# Patient Record
Sex: Female | Born: 1960 | Race: Black or African American | Hispanic: No | Marital: Married | Smoking: Current every day smoker
Health system: Southern US, Community
[De-identification: ages and names within clinical notes are randomized; demographics above are authoritative.]

## PROBLEM LIST (undated history)

## (undated) DIAGNOSIS — E785 Hyperlipidemia, unspecified: Secondary | ICD-10-CM

## (undated) DIAGNOSIS — I10 Essential (primary) hypertension: Secondary | ICD-10-CM

## (undated) DIAGNOSIS — M1990 Unspecified osteoarthritis, unspecified site: Secondary | ICD-10-CM

## (undated) HISTORY — DX: Hyperlipidemia, unspecified: E78.5

## (undated) HISTORY — PX: OOPHORECTOMY: SHX86

---

## 2002-10-30 HISTORY — PX: COLONOSCOPY: SHX174

## 2004-09-09 ENCOUNTER — Emergency Department: Payer: Self-pay | Admitting: Emergency Medicine

## 2004-10-25 ENCOUNTER — Emergency Department: Payer: Self-pay | Admitting: Emergency Medicine

## 2004-10-30 HISTORY — PX: ABDOMINAL HYSTERECTOMY: SHX81

## 2005-11-18 ENCOUNTER — Emergency Department: Payer: Self-pay | Admitting: Emergency Medicine

## 2011-10-31 HISTORY — PX: BREAST CYST ASPIRATION: SHX578

## 2015-12-16 ENCOUNTER — Encounter: Payer: Self-pay | Admitting: General Surgery

## 2015-12-27 ENCOUNTER — Ambulatory Visit (INDEPENDENT_AMBULATORY_CARE_PROVIDER_SITE_OTHER): Admitting: General Surgery

## 2015-12-27 ENCOUNTER — Encounter: Payer: Self-pay | Admitting: General Surgery

## 2015-12-27 VITALS — BP 132/78 | HR 78 | Resp 12 | Ht 64.0 in | Wt 165.0 lb

## 2015-12-27 DIAGNOSIS — D1720 Benign lipomatous neoplasm of skin and subcutaneous tissue of unspecified limb: Secondary | ICD-10-CM

## 2015-12-27 NOTE — Patient Instructions (Addendum)
Patient to be scheduled at [facility] for right scapula lipoma excision.   This patient's surgery has been scheduled for 01-03-16 at [facility].

## 2015-12-27 NOTE — Progress Notes (Signed)
Patient ID: Teresa Cordova, female   DOB: May 06, 1961, 55 y.o.   MRN: [id number]  Chief Complaint  Patient presents with  . Other    lump on right scalpula    HPI Teresa Cordova is a 55 y.o. female here today for a evaluation of a right scapula mass. Patient states she noticed this area about  2 years and has got bigger in the last year. No pain. Has never noticed anything like this before. Family history of colon cancer. Last colonoscopy 2004 I have reviewed the history of present illness with the patient.  HPI  Past Medical History  Diagnosis Date  . Hyperlipidemia     Past Surgical History  Procedure Laterality Date  . Abdominal hysterectomy  2006  . Cesarean section  1981  . Colonoscopy  2004    Family History  Problem Relation Age of Onset  . Lung cancer Mother   . Colon cancer Father     Social History Social History  Substance Use Topics  . Smoking status: Current Every Day Smoker -- 20 years  . Smokeless tobacco: None  . Alcohol Use: 0.6 oz/week    1 Standard drinks or equivalent per week    Allergies  Allergen Reactions  . Penicillins Hives    No current outpatient prescriptions on file.   No current facility-administered medications for this visit.    Review of Systems Review of Systems  Constitutional: Negative.   Respiratory: Negative.   Cardiovascular: Negative.     Blood pressure 132/78, pulse 78, resp. rate 12, height 5\' 4"  (1.626 m), weight 165 lb (74.844 kg).  Physical Exam Physical Exam  Constitutional: She is oriented to person, place, and time. She appears well-developed and well-nourished.  Eyes: Conjunctivae are normal. No scleral icterus.  Neck: Neck supple.  Cardiovascular: Normal rate, regular rhythm and normal heart sounds.   Pulmonary/Chest: Effort normal and breath sounds normal.    Lymphadenopathy:    She has no cervical adenopathy.    She has no axillary adenopathy.  Neurological: She is alert and oriented to person,  place, and time.  Skin: Skin is warm and dry.    Data Reviewed Notes reviewed  Assessment   Large right scapula lipoma. Recommend excision in SDS   Plan    Procedure risks and benefits explained. Pt is agreeable.  Patient to be scheduled at [facility] for right scapula lipoma excision.   This patient's surgery has been scheduled for 01-03-16 at [facility].    This information has been scribed by Gaspar Cola CMA.     Sydni Elizarraraz G 12/27/2015, 2:23 PM

## 2015-12-28 ENCOUNTER — Telehealth: Payer: Self-pay | Admitting: *Deleted

## 2015-12-28 NOTE — Telephone Encounter (Signed)
Patient is having surgery on 01/03/16 by Dr.Sankar and needs to reschedule.

## 2015-12-29 NOTE — Telephone Encounter (Signed)
Patient contacted and states she forgot about an appointment her husband has and will not be able to have surgery on 01-03-16 at [facility].  This patient's surgery has been rescheduled for 01-13-16.

## 2015-12-30 ENCOUNTER — Other Ambulatory Visit: Payer: Self-pay

## 2015-12-30 ENCOUNTER — Encounter: Payer: Self-pay | Admitting: *Deleted

## 2015-12-30 NOTE — Patient Instructions (Signed)
  Your procedure is scheduled on: 01-13-16  Report to [place] To find out your arrival time please call [phone] between 1PM - 3PM on 01-12-16  Remember: Instructions that are not followed completely may result in serious medical risk, up to and including death, or upon the discretion of your surgeon and anesthesiologist your surgery may need to be rescheduled.    _X___ 1. Do not eat food or drink liquids after midnight. No gum chewing or hard candies.     _X___ 2. No Alcohol for 24 hours before or after surgery.   ____ 3. Bring all medications with you on the day of surgery if instructed.    ____ 4. Notify your doctor if there is any change in your medical condition     (cold, fever, infections).     Do not wear jewelry, make-up, hairpins, clips or nail polish.  Do not wear lotions, powders, or perfumes. You may wear deodorant.  Do not shave 48 hours prior to surgery. Men may shave face and neck.  Do not bring valuables to the hospital.    [facility] is not responsible for any belongings or valuables.               Contacts, dentures or bridgework may not be worn into surgery.  Leave your suitcase in the car. After surgery it may be brought to your room.  For patients admitted to the hospital, discharge time is determined by your treatment team.   Patients discharged the day of surgery will not be allowed to drive home.   Please read over the following fact sheets that you were given:     ____ Take these medicines the morning of surgery with A SIP OF WATER:    1. NONE  2.   3.   4.  5.  6.  ____ Fleet Enema (as directed)   ____ Use CHG Soap as directed  ____ Use inhalers on the day of surgery  ____ Stop metformin 2 days prior to surgery    ____ Take 1/2 of usual insulin dose the night before surgery and none on the morning of surgery.   ____ Stop Coumadin/Plavix/aspirin-N/A  _X___ Stop Anti-inflammatories-STOP IBUPROFEN 7 DAYS  PRIOR TO SURGERY-NO NSAIDS OR ASA PRODUCTS-TYLENOL OK    ____ Stop supplements until after surgery.    ____ Bring C-Pap to the hospital.

## 2016-01-13 ENCOUNTER — Ambulatory Visit: Admitting: Anesthesiology

## 2016-01-13 ENCOUNTER — Encounter: Payer: Self-pay | Admitting: Anesthesiology

## 2016-01-13 ENCOUNTER — Encounter
Admission: RE | Disposition: A | Discharge status: Home / Self Care | Payer: Self-pay | Source: Ambulatory Visit | Attending: General Surgery

## 2016-01-13 ENCOUNTER — Ambulatory Visit
Admission: RE | Admit: 2016-01-13 | Discharge: 2016-01-13 | Disposition: A | Discharge status: Home / Self Care | Source: Ambulatory Visit | Attending: General Surgery | Admitting: General Surgery

## 2016-01-13 DIAGNOSIS — Z801 Family history of malignant neoplasm of trachea, bronchus and lung: Secondary | ICD-10-CM | POA: Diagnosis not present

## 2016-01-13 DIAGNOSIS — Z880 Allergy status to penicillin: Secondary | ICD-10-CM | POA: Diagnosis not present

## 2016-01-13 DIAGNOSIS — Z800 Family history of malignant neoplasm of digestive organs: Secondary | ICD-10-CM | POA: Insufficient documentation

## 2016-01-13 DIAGNOSIS — F17200 Nicotine dependence, unspecified, uncomplicated: Secondary | ICD-10-CM | POA: Diagnosis not present

## 2016-01-13 DIAGNOSIS — Z90710 Acquired absence of both cervix and uterus: Secondary | ICD-10-CM | POA: Diagnosis not present

## 2016-01-13 DIAGNOSIS — E785 Hyperlipidemia, unspecified: Secondary | ICD-10-CM | POA: Insufficient documentation

## 2016-01-13 DIAGNOSIS — M1990 Unspecified osteoarthritis, unspecified site: Secondary | ICD-10-CM | POA: Diagnosis not present

## 2016-01-13 DIAGNOSIS — D1721 Benign lipomatous neoplasm of skin and subcutaneous tissue of right arm: Secondary | ICD-10-CM | POA: Diagnosis not present

## 2016-01-13 DIAGNOSIS — D171 Benign lipomatous neoplasm of skin and subcutaneous tissue of trunk: Secondary | ICD-10-CM | POA: Diagnosis present

## 2016-01-13 DIAGNOSIS — D1720 Benign lipomatous neoplasm of skin and subcutaneous tissue of unspecified limb: Secondary | ICD-10-CM

## 2016-01-13 HISTORY — DX: Unspecified osteoarthritis, unspecified site: M19.90

## 2016-01-13 HISTORY — PX: EXCISION MASS UPPER EXTREMETIES: SHX6704

## 2016-01-13 SURGERY — EXCISION MASS UPPER EXTREMITIES
Anesthesia: General | Laterality: Right | Wound class: Clean

## 2016-01-13 MED ORDER — OXYCODONE HCL 5 MG/5ML PO SOLN: 5.0000 mg | Freq: Once | ORAL | Status: AC | PRN

## 2016-01-13 MED ORDER — BUPIVACAINE-EPINEPHRINE 0.5% -1:200000 IJ SOLN
INTRAMUSCULAR | Status: DC | PRN
  Administered 2016-01-13: 14 mL

## 2016-01-13 MED ORDER — GLYCOPYRROLATE 0.2 MG/ML IJ SOLN
INTRAMUSCULAR | Status: DC | PRN
  Administered 2016-01-13: 0.2 mg via INTRAVENOUS

## 2016-01-13 MED ORDER — FAMOTIDINE 20 MG PO TABS
20.0000 mg | ORAL_TABLET | Freq: Once | ORAL | Status: AC
  Administered 2016-01-13: 20 mg via ORAL

## 2016-01-13 MED ORDER — EPHEDRINE SULFATE 50 MG/ML IJ SOLN
INTRAMUSCULAR | Status: DC | PRN
  Administered 2016-01-13: 5 mg via INTRAVENOUS
  Administered 2016-01-13: 10 mg via INTRAVENOUS

## 2016-01-13 MED ORDER — ONDANSETRON HCL 4 MG/2ML IJ SOLN
INTRAMUSCULAR | Status: DC | PRN
  Administered 2016-01-13: 4 mg via INTRAVENOUS

## 2016-01-13 MED ORDER — CHLORHEXIDINE GLUCONATE 4 % EX LIQD: 1.0000 "application " | Freq: Once | CUTANEOUS | Status: DC

## 2016-01-13 MED ORDER — DEXAMETHASONE SODIUM PHOSPHATE 10 MG/ML IJ SOLN
INTRAMUSCULAR | Status: DC | PRN
  Administered 2016-01-13: 5 mg via INTRAVENOUS

## 2016-01-13 MED ORDER — BUPIVACAINE-EPINEPHRINE (PF) 0.5% -1:200000 IJ SOLN
INTRAMUSCULAR | Status: AC
  Filled 2016-01-13: qty 30

## 2016-01-13 MED ORDER — PHENYLEPHRINE HCL 10 MG/ML IJ SOLN
INTRAMUSCULAR | Status: DC | PRN
  Administered 2016-01-13 (×5): 100 ug via INTRAVENOUS

## 2016-01-13 MED ORDER — FAMOTIDINE 20 MG PO TABS
ORAL_TABLET | ORAL | Status: AC
  Administered 2016-01-13: 20 mg via ORAL
  Filled 2016-01-13: qty 1

## 2016-01-13 MED ORDER — LACTATED RINGERS IV SOLN
INTRAVENOUS | Status: DC
  Administered 2016-01-13: 08:00:00 via INTRAVENOUS

## 2016-01-13 MED ORDER — TRAMADOL HCL 50 MG PO TABS: 50.0000 mg | ORAL_TABLET | Freq: Four times a day (QID) | ORAL | Status: AC | PRN

## 2016-01-13 MED ORDER — OXYCODONE HCL 5 MG PO TABS
ORAL_TABLET | ORAL | Status: AC
  Filled 2016-01-13: qty 1

## 2016-01-13 MED ORDER — PROPOFOL 10 MG/ML IV BOLUS
INTRAVENOUS | Status: DC | PRN
  Administered 2016-01-13: 100 mg via INTRAVENOUS
  Administered 2016-01-13: 200 mg via INTRAVENOUS

## 2016-01-13 MED ORDER — OXYCODONE HCL 5 MG PO TABS
5.0000 mg | ORAL_TABLET | Freq: Once | ORAL | Status: AC | PRN
  Administered 2016-01-13: 5 mg via ORAL

## 2016-01-13 MED ORDER — FENTANYL CITRATE (PF) 100 MCG/2ML IJ SOLN
INTRAMUSCULAR | Status: DC | PRN
  Administered 2016-01-13 (×3): 50 ug via INTRAVENOUS

## 2016-01-13 MED ORDER — LIDOCAINE HCL (PF) 2 % IJ SOLN
INTRAMUSCULAR | Status: DC | PRN
  Administered 2016-01-13: 50 mg

## 2016-01-13 MED ORDER — MIDAZOLAM HCL 5 MG/5ML IJ SOLN
INTRAMUSCULAR | Status: DC | PRN
  Administered 2016-01-13: 2 mg via INTRAVENOUS

## 2016-01-13 MED ORDER — FENTANYL CITRATE (PF) 100 MCG/2ML IJ SOLN: 25.0000 ug | INTRAMUSCULAR | Status: DC | PRN

## 2016-01-13 SURGICAL SUPPLY — 15 items
BNDG COHESIVE 4X5 TAN STRL (GAUZE/BANDAGES/DRESSINGS) ×3 IMPLANT
CANISTER SUCT 1200ML W/VALVE (MISCELLANEOUS) ×3 IMPLANT
CHLORAPREP W/TINT 26ML (MISCELLANEOUS) ×3 IMPLANT
ELECT REM PT RETURN 9FT ADLT (ELECTROSURGICAL) ×3
ELECTRODE REM PT RTRN 9FT ADLT (ELECTROSURGICAL) ×1 IMPLANT
GLOVE BIO SURGEON STRL SZ7 (GLOVE) ×15 IMPLANT
GOWN STRL REUS W/ TWL LRG LVL3 (GOWN DISPOSABLE) ×3 IMPLANT
GOWN STRL REUS W/TWL LRG LVL3 (GOWN DISPOSABLE) ×6
LABEL OR SOLS (LABEL) ×3 IMPLANT
LIQUID BAND (GAUZE/BANDAGES/DRESSINGS) ×3 IMPLANT
PACK EXTREMITY ARMC (MISCELLANEOUS) ×3 IMPLANT
STOCKINETTE IMPERVIOUS 9X36 MD (GAUZE/BANDAGES/DRESSINGS) ×3 IMPLANT
SUT VIC AB 3-0 SH 27 (SUTURE) ×2
SUT VIC AB 3-0 SH 27X BRD (SUTURE) ×1 IMPLANT
SUT VIC AB 4-0 FS2 27 (SUTURE) ×3 IMPLANT

## 2016-01-13 NOTE — Interval H&P Note (Signed)
History and Physical Interval Note:  01/13/2016 9:09 AM  Teresa Cordova  has presented today for surgery, with the diagnosis of Lipoma right scalpular area  The various methods of treatment have been discussed with the patient and family. After consideration of risks, benefits and other options for treatment, the patient has consented to  Procedure(s): EXCISION MASS UPPER EXTREMETIES (Right) as a surgical intervention .  The patient's history has been reviewed, patient examined, no change in status, stable for surgery.  I have reviewed the patient's chart and labs.  Questions were answered to the patient's satisfaction.     SANKAR,SEEPLAPUTHUR G

## 2016-01-13 NOTE — Op Note (Signed)
Preop diagnosis: Lipoma right shoulder area  Post op diagnosis: Same  Operation: Excision large lipoma from the right scapular region  Surgeon: S.G.Sankar  Assistant:     Anesthesia: Gen.  Complications: None  EBL: Minimal  Drains: None  Description: Patient was put to sleep with an LMA and subsequently placed on the left lateral position held in place with a beanbag. The right scapular region was prepped and draped sterile field and timeout performed. Patient had a 8-10 cm size lipoma located over the scapula. Skin incision was made in transverse orientation approximately 4 cm since the length. 0.5% Marcaine with epi was instilled to total of 14 mL. Skin incision was made in an intermediate beneath the skin was a somewhat the irregularly-shaped lipomatous mass. This was carefully freed from the overlying skin all the way around and gradually worked towards the age of this. Using blunt and sharp dissection the lipoma was dissected off the fascia part of the way after which it was firmly adherent to the fascia. Accordingly a small portion of the fascia was excised out along with the lipoma. The underlying muscle did not appear to contain any extension of this lipoma into the muscle. After removal the lipomatous mass measuring a maximum about 10 cm in length. Hemostasis obtained with cautery and the wound irrigated with saline. The deep cuticular and subcutaneous tissue was closed with 3 interrupted 3-0 Vicryl. Skin was closed with the subcuticular 4-0 Monocryl and reinforced with the Norris. No immediate problems were encountered and patient subsequently was returned recovery room stable condition.

## 2016-01-13 NOTE — Anesthesia Procedure Notes (Signed)
Procedure Name: LMA Insertion Performed by: Rolla Plate Pre-anesthesia Checklist: Patient identified, Patient being monitored, Timeout performed, Emergency Drugs available and Suction available Patient Re-evaluated:Patient Re-evaluated prior to inductionOxygen Delivery Method: Circle system utilized Preoxygenation: Pre-oxygenation with 100% oxygen Intubation Type: IV induction Ventilation: Mask ventilation without difficulty LMA: LMA inserted LMA Size: 3.5 Tube type: Oral Number of attempts: 2 Placement Confirmation: positive ETCO2 and breath sounds checked- equal and bilateral Tube secured with: Tape Dental Injury: Teeth and Oropharynx as per pre-operative assessment

## 2016-01-13 NOTE — Anesthesia Postprocedure Evaluation (Signed)
Anesthesia Post Note  Patient: Teresa Cordova  Procedure(s) Performed: Procedure(s) (LRB): EXCISION MASS scapula (Right)  Patient location during evaluation: PACU Anesthesia Type: General Level of consciousness: awake and alert Pain management: pain level controlled Vital Signs Assessment: post-procedure vital signs reviewed and stable Respiratory status: spontaneous breathing, nonlabored ventilation, respiratory function stable and patient connected to nasal cannula oxygen Cardiovascular status: blood pressure returned to baseline and stable Postop Assessment: no signs of nausea or vomiting Anesthetic complications: no    Last Vitals:  Filed Vitals:   01/13/16 1106 01/13/16 1118  BP:  106/66  Pulse: 60 58  Temp: 36.3 C 36.6 C  Resp: 15 16    Last Pain:  Filed Vitals:   01/13/16 1124  PainSc: 4                  Joseph K Piscitello

## 2016-01-13 NOTE — Anesthesia Preprocedure Evaluation (Signed)
Anesthesia Evaluation  Patient identified by MRN, date of birth, ID band Patient awake    Reviewed: Allergy & Precautions, H&P , NPO status , Patient's Chart, lab work & pertinent test results  Airway Mallampati: III  TM Distance: >3 FB Neck ROM: limited    Dental  (+) Poor Dentition   Pulmonary neg shortness of breath, Current Smoker,    Pulmonary exam normal breath sounds clear to auscultation       Cardiovascular Exercise Tolerance: Good (-) angina(-) Past MI and (-) DOE negative cardio ROS Normal cardiovascular exam Rhythm:regular Rate:Normal     Neuro/Psych negative neurological ROS  negative psych ROS   GI/Hepatic negative GI ROS, Neg liver ROS, neg GERD  ,  Endo/Other  negative endocrine ROS  Renal/GU negative Renal ROS  negative genitourinary   Musculoskeletal  (+) Arthritis ,   Abdominal   Peds  Hematology negative hematology ROS (+)   Anesthesia Other Findings Past Medical History:   Hyperlipidemia                                               Arthritis                                                      Comment:RIGHT HAND  Past Surgical History:   ABDOMINAL HYSTERECTOMY                           2006         [place]         COLONOSCOPY                                      2004        BMI    Body Mass Index   26.76 kg/m 2    Signs and symptoms suggestive of sleep apnea     Reproductive/Obstetrics negative OB ROS                             Anesthesia Physical Anesthesia Plan  ASA: III  Anesthesia Plan: General LMA   Post-op Pain Management:    Induction:   Airway Management Planned:   Additional Equipment:   Intra-op Plan:   Post-operative Plan:   Informed Consent: I have reviewed the patients History and Physical, chart, labs and discussed the procedure including the risks, benefits and alternatives for  the proposed anesthesia with the patient or authorized representative who has indicated his/her understanding and acceptance.   Dental Advisory Given  Plan Discussed with: Anesthesiologist, CRNA and Surgeon  Anesthesia Plan Comments:         Anesthesia Quick Evaluation

## 2016-01-13 NOTE — Transfer of Care (Signed)
Immediate Anesthesia Transfer of Care Note  Patient: Teresa Cordova  Procedure(s) Performed: Procedure(s): EXCISION MASS scapula (Right)  Patient Location: PACU  Anesthesia Type:General  Level of Consciousness: awake and alert   Airway & Oxygen Therapy: Patient Spontanous Breathing and Patient connected to face mask oxygen  Post-op Assessment: Report given to RN  Post vital signs: Reviewed  Last Vitals:  Filed Vitals:   01/13/16 0801 01/13/16 1027  BP: 141/95 134/81  Pulse: 58 89  Temp: 36.4 C 36.6 C  Resp: 16 15    Complications: No apparent anesthesia complications

## 2016-01-13 NOTE — H&P (View-Only) (Signed)
Patient ID: Teresa Cordova, female   DOB: Aug 26, 1961, 55 y.o.   MRN: [id number]  Chief Complaint  Patient presents with  . Other    lump on right scalpula    HPI GEANNINE Cordova is a 55 y.o. female here today for a evaluation of a right scapula mass. Patient states she noticed this area about  2 years and has got bigger in the last year. No pain. Has never noticed anything like this before. Family history of colon cancer. Last colonoscopy 2004 I have reviewed the history of present illness with the patient.  HPI  Past Medical History  Diagnosis Date  . Hyperlipidemia     Past Surgical History  Procedure Laterality Date  . Abdominal hysterectomy  2006  . Cesarean section  1981  . Colonoscopy  2004    Family History  Problem Relation Age of Onset  . Lung cancer Mother   . Colon cancer Father     Social History Social History  Substance Use Topics  . Smoking status: Current Every Day Smoker -- 20 years  . Smokeless tobacco: None  . Alcohol Use: 0.6 oz/week    1 Standard drinks or equivalent per week    Allergies  Allergen Reactions  . Penicillins Hives    No current outpatient prescriptions on file.   No current facility-administered medications for this visit.    Review of Systems Review of Systems  Constitutional: Negative.   Respiratory: Negative.   Cardiovascular: Negative.     Blood pressure 132/78, pulse 78, resp. rate 12, height 5\' 4"  (1.626 m), weight 165 lb (74.844 kg).  Physical Exam Physical Exam  Constitutional: She is oriented to person, place, and time. She appears well-developed and well-nourished.  Eyes: Conjunctivae are normal. No scleral icterus.  Neck: Neck supple.  Cardiovascular: Normal rate, regular rhythm and normal heart sounds.   Pulmonary/Chest: Effort normal and breath sounds normal.    Lymphadenopathy:    She has no cervical adenopathy.    She has no axillary adenopathy.  Neurological: She is alert and oriented to person,  place, and time.  Skin: Skin is warm and dry.    Data Reviewed Notes reviewed  Assessment   Large right scapula lipoma. Recommend excision in SDS   Plan    Procedure risks and benefits explained. Pt is agreeable.  Patient to be scheduled at [facility] for right scapula lipoma excision.   This patient's surgery has been scheduled for 01-03-16 at [facility].    This information has been scribed by Gaspar Cola CMA.     SANKAR,SEEPLAPUTHUR G 12/27/2015, 2:23 PM

## 2016-01-14 LAB — SURGICAL PATHOLOGY

## 2016-01-18 ENCOUNTER — Telehealth: Payer: Self-pay | Admitting: *Deleted

## 2016-01-18 NOTE — Telephone Encounter (Signed)
Notified patient as instructed, patient pleased. Discussed follow-up appointments, patient agrees  

## 2016-01-18 NOTE — Telephone Encounter (Signed)
-----   Message from Christene Lye, MD sent at 01/17/2016  8:44 AM EDT ----- Please let pt pt know the pathology was normal.

## 2016-01-19 ENCOUNTER — Ambulatory Visit (INDEPENDENT_AMBULATORY_CARE_PROVIDER_SITE_OTHER): Admitting: General Surgery

## 2016-01-19 ENCOUNTER — Encounter: Payer: Self-pay | Admitting: General Surgery

## 2016-01-19 VITALS — BP 110/68 | HR 62 | Resp 12 | Ht 64.0 in | Wt 152.0 lb

## 2016-01-19 DIAGNOSIS — D1720 Benign lipomatous neoplasm of skin and subcutaneous tissue of unspecified limb: Secondary | ICD-10-CM

## 2016-01-19 NOTE — Progress Notes (Signed)
Here today for postoperative visit, mass excision right scapula. She states she is doing well. The area is still a little tender.  I have reviewed the history of present illness with the patient.  Incision is intact  Small seroma still remains but pt advised that it should resolve. Aware of pathology, benign lipoma Follow up as needed   PCP:  Threasa Alpha This information has been scribed by Karie Fetch RNBC.

## 2016-01-19 NOTE — Patient Instructions (Signed)
The patient is aware to call back for any questions or concerns.  

## 2017-01-12 ENCOUNTER — Other Ambulatory Visit: Payer: Self-pay | Admitting: Family Medicine

## 2017-01-24 ENCOUNTER — Inpatient Hospital Stay
Admission: RE | Admit: 2017-01-24 | Discharge: 2017-01-24 | Disposition: A | Discharge status: Home / Self Care | Payer: Self-pay | Source: Ambulatory Visit | Attending: *Deleted | Admitting: *Deleted

## 2017-01-24 ENCOUNTER — Other Ambulatory Visit: Payer: Self-pay | Admitting: *Deleted

## 2017-01-24 DIAGNOSIS — Z9289 Personal history of other medical treatment: Secondary | ICD-10-CM

## 2017-01-31 ENCOUNTER — Other Ambulatory Visit: Payer: Self-pay | Admitting: Family Medicine

## 2017-02-01 ENCOUNTER — Other Ambulatory Visit: Payer: Self-pay | Admitting: Family Medicine

## 2017-02-01 DIAGNOSIS — R928 Other abnormal and inconclusive findings on diagnostic imaging of breast: Secondary | ICD-10-CM

## 2017-02-09 ENCOUNTER — Ambulatory Visit
Admission: RE | Admit: 2017-02-09 | Discharge: 2017-02-09 | Disposition: A | Discharge status: Home / Self Care | Source: Ambulatory Visit | Attending: Family Medicine | Admitting: Family Medicine

## 2017-02-09 DIAGNOSIS — R928 Other abnormal and inconclusive findings on diagnostic imaging of breast: Secondary | ICD-10-CM

## 2017-02-09 DIAGNOSIS — N6321 Unspecified lump in the left breast, upper outer quadrant: Secondary | ICD-10-CM | POA: Insufficient documentation

## 2018-08-01 ENCOUNTER — Other Ambulatory Visit: Payer: Self-pay | Admitting: Family Medicine

## 2018-08-01 DIAGNOSIS — Z1231 Encounter for screening mammogram for malignant neoplasm of breast: Secondary | ICD-10-CM

## 2018-08-13 ENCOUNTER — Ambulatory Visit
Admission: RE | Admit: 2018-08-13 | Discharge: 2018-08-13 | Disposition: A | Discharge status: Home / Self Care | Source: Ambulatory Visit | Attending: Family Medicine | Admitting: Family Medicine

## 2018-08-13 DIAGNOSIS — Z1231 Encounter for screening mammogram for malignant neoplasm of breast: Secondary | ICD-10-CM | POA: Insufficient documentation

## 2019-08-11 ENCOUNTER — Other Ambulatory Visit: Payer: Self-pay | Admitting: Family Medicine

## 2019-08-11 DIAGNOSIS — Z1231 Encounter for screening mammogram for malignant neoplasm of breast: Secondary | ICD-10-CM

## 2019-09-15 ENCOUNTER — Ambulatory Visit

## 2020-08-04 ENCOUNTER — Other Ambulatory Visit: Payer: Self-pay | Admitting: Family Medicine

## 2020-08-04 DIAGNOSIS — Z1231 Encounter for screening mammogram for malignant neoplasm of breast: Secondary | ICD-10-CM

## 2020-08-05 ENCOUNTER — Encounter: Payer: Self-pay | Admitting: Oncology

## 2020-08-06 ENCOUNTER — Encounter (INDEPENDENT_AMBULATORY_CARE_PROVIDER_SITE_OTHER): Payer: Self-pay

## 2020-08-06 ENCOUNTER — Encounter: Payer: Self-pay | Admitting: Oncology

## 2020-08-06 ENCOUNTER — Inpatient Hospital Stay: Attending: Oncology | Admitting: Oncology

## 2020-08-06 ENCOUNTER — Other Ambulatory Visit: Payer: Self-pay

## 2020-08-06 ENCOUNTER — Inpatient Hospital Stay

## 2020-08-06 DIAGNOSIS — D45 Polycythemia vera: Secondary | ICD-10-CM | POA: Insufficient documentation

## 2020-08-06 DIAGNOSIS — Z800 Family history of malignant neoplasm of digestive organs: Secondary | ICD-10-CM | POA: Insufficient documentation

## 2020-08-06 DIAGNOSIS — Z801 Family history of malignant neoplasm of trachea, bronchus and lung: Secondary | ICD-10-CM | POA: Diagnosis not present

## 2020-08-06 DIAGNOSIS — D751 Secondary polycythemia: Secondary | ICD-10-CM

## 2020-08-06 DIAGNOSIS — M129 Arthropathy, unspecified: Secondary | ICD-10-CM | POA: Diagnosis not present

## 2020-08-06 DIAGNOSIS — E785 Hyperlipidemia, unspecified: Secondary | ICD-10-CM | POA: Diagnosis not present

## 2020-08-06 DIAGNOSIS — F17210 Nicotine dependence, cigarettes, uncomplicated: Secondary | ICD-10-CM | POA: Insufficient documentation

## 2020-08-06 LAB — CBC
HCT: 39.7 % (ref 36.0–46.0)
Hemoglobin: 13.4 g/dL (ref 12.0–15.0)
MCH: 28.9 pg (ref 26.0–34.0)
MCHC: 33.8 g/dL (ref 30.0–36.0)
MCV: 85.7 fL (ref 80.0–100.0)
Platelets: 257 10*3/uL (ref 150–400)
RBC: 4.63 MIL/uL (ref 3.87–5.11)
RDW: 12.4 % (ref 11.5–15.5)
WBC: 7.7 10*3/uL (ref 4.0–10.5)
nRBC: 0 % (ref 0.0–0.2)

## 2020-08-06 LAB — IRON AND TIBC
Iron: 73 ug/dL (ref 28–170)
Saturation Ratios: 17 % (ref 10.4–31.8)
TIBC: 437 ug/dL (ref 250–450)
UIBC: 364 ug/dL

## 2020-08-06 LAB — FERRITIN: Ferritin: 103 ng/mL (ref 11–307)

## 2020-08-06 NOTE — Progress Notes (Signed)
[facility]  [phone] [phone]) [phone]  ID: Teresa Cordova OB: 01-28-1961  MR#: [id number]  YTK#:[id number]  Patient Care Team: Remi Haggard, FNP as PCP - General (Family Medicine) Lorelee Market, MD (Family Medicine) Christene Lye, MD (General Surgery)  CHIEF COMPLAINT: Polycythemia.   INTERVAL HISTORY: Patient is a 59 year old female who was noted to have an elevated hemoglobin on routine blood work.  She is anxious, but otherwise feels well.  She has no neurologic complaints.  She denies any weakness or fatigue.  She has good appetite and denies weight loss.  She has no chest pain, shortness of breath, cough, or hemoptysis.  She denies any nausea, vomiting, constipation, or diarrhea.  She has no urinary complaints.  Patient feels at her baseline offers no further specific complaints today.  REVIEW OF SYSTEMS:   Review of Systems  Constitutional: Negative.  Negative for fever, malaise/fatigue and weight loss.  Respiratory: Negative.  Negative for cough, hemoptysis and shortness of breath.   Cardiovascular: Negative.  Negative for chest pain and leg swelling.  Gastrointestinal: Negative.  Negative for abdominal pain.  Genitourinary: Negative.  Negative for dysuria.  Musculoskeletal: Negative.  Negative for back pain.  Skin: Negative.  Negative for rash.  Neurological: Negative.  Negative for dizziness, focal weakness, weakness and headaches.  Psychiatric/Behavioral: The patient is nervous/anxious.     As per HPI. Otherwise, a complete review of systems is negative.  PAST MEDICAL HISTORY: Past Medical History:  Diagnosis Date   Arthritis    RIGHT HAND   Hyperlipidemia     PAST SURGICAL HISTORY: Past Surgical History:  Procedure Laterality Date   ABDOMINAL HYSTERECTOMY  2006   BREAST CYST ASPIRATION Left 2013   CESAREAN SECTION  1981   COLONOSCOPY  2004   EXCISION MASS UPPER EXTREMETIES Right 01/13/2016    Procedure: EXCISION MASS scapula;  Surgeon: Christene Lye, MD;  Location: [facility] ORS;  Service: General;  Laterality: Right;   OOPHORECTOMY Right     FAMILY HISTORY: Family History  Problem Relation Age of Onset   Lung cancer Mother    Colon cancer Father    Breast cancer Neg Hx     ADVANCED DIRECTIVES (Y/N):  N  HEALTH MAINTENANCE: Social History   Tobacco Use   Smoking status: Current Every Day Smoker    Packs/day: 1.00    Years: 20.00    Pack years: 20.00    Types: Cigarettes   Smokeless tobacco: Never Used  Vaping Use   Vaping Use: Never used  Substance Use Topics   Alcohol use: Yes    Alcohol/week: 1.0 standard drink    Types: 1 Standard drinks or equivalent per week    Comment: OCC   Drug use: No     Colonoscopy:  PAP:  Bone density:  Lipid panel:  Allergies  Allergen Reactions   Codeine    Lipitor [Atorvastatin]    Penicillins Hives   Pravastatin    Zetia [Ezetimibe]     Current Outpatient Medications  Medication Sig Dispense Refill   ascorbic acid (VITAMIN C) 500 MG tablet Take 500 mg by mouth daily.     aspirin 81 MG EC tablet Take 1 tablet by mouth daily.     Azelastine-Fluticasone (DYMISTA) 137-50 MCG/ACT SUSP Place 1 spray into the nose daily.     Bempedoic Acid (NEXLETOL) 180 MG TABS Take 1 tablet by mouth daily.     Cholecalciferol (VITAMIN D-3) 125 MCG (5000 UT) TABS Take 1 tablet  by mouth daily.     rOPINIRole (REQUIP) 0.5 MG tablet Take 0.5 mg by mouth 3 (three) times daily.     chlorthalidone (HYGROTON) 25 MG tablet Take 25 mg by mouth daily. (Patient not taking: Reported on 08/06/2020)     Diclofenac Sodium CR 100 MG 24 hr tablet Take 1 tablet by mouth daily as needed. (Patient not taking: Reported on 08/06/2020)     FLUoxetine (PROZAC) 10 MG capsule Take 10 mg by mouth daily. (Patient not taking: Reported on 08/06/2020)     gemfibrozil (LOPID) 600 MG tablet Take 600 mg by mouth 2 (two) times daily before a meal.  (Patient not taking: Reported on 08/06/2020)     nicotine (NICODERM CQ - DOSED IN MG/24 HOURS) 21 mg/24hr patch Place 21 mg onto the skin daily. (Patient not taking: Reported on 08/06/2020)     traMADol (ULTRAM) 50 MG tablet Take 1 tablet (50 mg total) by mouth every 6 (six) hours as needed. (Patient not taking: Reported on 08/06/2020) 30 tablet 0   No current facility-administered medications for this visit.    OBJECTIVE: Vitals:   08/06/20 1322  BP: (!) 154/87  Pulse: 81  Temp: (!) 97.1 F (36.2 C)  SpO2: 100%     Body mass index is 25.64 kg/m.    ECOG FS:0 - Asymptomatic  General: Well-developed, well-nourished, no acute distress. Eyes: Pink conjunctiva, anicteric sclera. HEENT: Normocephalic, moist mucous membranes. Lungs: No audible wheezing or coughing. Heart: Regular rate and rhythm. Abdomen: Soft, nontender, no obvious distention. Musculoskeletal: No edema, cyanosis, or clubbing. Neuro: Alert, answering all questions appropriately. Cranial nerves grossly intact. Skin: No rashes or petechiae noted. Psych: Normal affect. Lymphatics: No cervical, calvicular, axillary or inguinal LAD.   LAB RESULTS:  No results found for: NA, K, CL, CO2, GLUCOSE, BUN, CREATININE, CALCIUM, PROT, ALBUMIN, AST, ALT, ALKPHOS, BILITOT, GFRNONAA, GFRAA  Lab Results  Component Value Date   WBC 7.7 08/06/2020   HGB 13.4 08/06/2020   HCT 39.7 08/06/2020   MCV 85.7 08/06/2020   PLT 257 08/06/2020     STUDIES: No results found.  ASSESSMENT: Polycythemia  PLAN:    1. Polycythemia: Patient's hemoglobin is within normal limits today.  Unclear her transient increase.  Iron stores, erythropoietin, JAK2 mutation, hemochromatosis mutation, and carbon monoxide levels were all drawn for completeness.  No intervention is needed at this time.  Patient does not require phlebotomy.  She will have a video assisted telemedicine visit in 3 weeks to discuss the results and any additional diagnostic  planning necessary.  I spent a total of 45 minutes reviewing chart data, face-to-face evaluation with the patient, counseling and coordination of care as detailed above.   Patient expressed understanding and was in agreement with this plan. She also understands that She can call clinic at any time with any questions, concerns, or complaints.     Lloyd Huger, MD   08/06/2020 3:55 PM

## 2020-08-07 LAB — ERYTHROPOIETIN: Erythropoietin: 8.3 m[IU]/mL (ref 2.6–18.5)

## 2020-08-09 LAB — CARBON MONOXIDE, BLOOD (PERFORMED AT REF LAB): Carbon Monoxide, Blood: 6.9 % — ABNORMAL HIGH (ref 0.0–3.6)

## 2020-08-11 LAB — JAK2 GENOTYPR

## 2020-08-11 LAB — HEMOCHROMATOSIS DNA-PCR(C282Y,H63D)

## 2020-08-20 NOTE — Progress Notes (Signed)
Center  [phone]) [phone] [phone]  ID: Teresa Cordova OB: 04-18-1961  MR#: [id number]  NOB#:[id number]  Patient Care Team: Remi Haggard, FNP as PCP - General (Family Medicine) Lorelee Market, MD (Family Medicine) Christene Lye, MD (General Surgery)  I connected with Teresa Cordova on 08/28/20 at  3:30 PM EDT by video enabled telemedicine visit and verified that I am speaking with the correct person using two identifiers.   I discussed the limitations, risks, security and privacy concerns of performing an evaluation and management service by telemedicine and the availability of in-person appointments. I also discussed with the patient that there may be a patient responsible charge related to this service. The patient expressed understanding and agreed to proceed.   Other persons participating in the visit and their role in the encounter: Patient, MD.  Patient's location: Home. Provider's location: Clinic.  CHIEF COMPLAINT: Polycythemia.   INTERVAL HISTORY: Patient initially agreed to video assisted telemedicine visit for discussion of her laboratory results, but secondary to technical difficulties visit was transitioned to telephone. She is anxious, but otherwise feels well. She has no neurologic complaints.  She denies any weakness or fatigue.  She has a good appetite and denies weight loss.  She has no chest pain, shortness of breath, cough, or hemoptysis.  She denies any nausea, vomiting, constipation, or diarrhea.  She has no urinary complaints. Patient feels at her baseline and offers no further specific complaints today.  REVIEW OF SYSTEMS:   Review of Systems  Constitutional: Negative.  Negative for fever, malaise/fatigue and weight loss.  Respiratory: Negative.  Negative for cough, hemoptysis and shortness of breath.   Cardiovascular: Negative.  Negative for chest pain and leg swelling.  Gastrointestinal: Negative.   Negative for abdominal pain.  Genitourinary: Negative.  Negative for dysuria.  Musculoskeletal: Negative.  Negative for back pain.  Skin: Negative.  Negative for rash.  Neurological: Negative.  Negative for dizziness, focal weakness, weakness and headaches.  Psychiatric/Behavioral: The patient is nervous/anxious.     As per HPI. Otherwise, a complete review of systems is negative.  PAST MEDICAL HISTORY: Past Medical History:  Diagnosis Date  . Arthritis    RIGHT HAND  . Hyperlipidemia     PAST SURGICAL HISTORY: Past Surgical History:  Procedure Laterality Date  . ABDOMINAL HYSTERECTOMY  2006  . BREAST CYST ASPIRATION Left 2013  . CESAREAN SECTION  1981  . COLONOSCOPY  2004  . EXCISION MASS UPPER EXTREMETIES Right 01/13/2016   Procedure: EXCISION MASS scapula;  Surgeon: Christene Lye, MD;  Location: [facility] ORS;  Service: General;  Laterality: Right;  . OOPHORECTOMY Right     FAMILY HISTORY: Family History  Problem Relation Age of Onset  . Lung cancer Mother   . Colon cancer Father   . Breast cancer Neg Hx     ADVANCED DIRECTIVES (Y/N):  N  HEALTH MAINTENANCE: Social History   Tobacco Use  . Smoking status: Current Every Day Smoker    Packs/day: 1.00    Years: 20.00    Pack years: 20.00    Types: Cigarettes  . Smokeless tobacco: Never Used  Vaping Use  . Vaping Use: Never used  Substance Use Topics  . Alcohol use: Yes    Alcohol/week: 1.0 standard drink    Types: 1 Standard drinks or equivalent per week    Comment: OCC  . Drug use: No     Colonoscopy:  PAP:  Bone density:  Lipid panel:  Allergies  Allergen Reactions  . Codeine   . Lipitor [Atorvastatin]   . Penicillins Hives  . Pravastatin   . Zetia [Ezetimibe]     Current Outpatient Medications  Medication Sig Dispense Refill  . aspirin 81 MG EC tablet Take 1 tablet by mouth daily.    . Azelastine-Fluticasone (DYMISTA) 137-50 MCG/ACT SUSP Place 1 spray into the nose daily.    .  Bempedoic Acid (NEXLETOL) 180 MG TABS Take 1 tablet by mouth daily.    . Cholecalciferol (VITAMIN D-3) 125 MCG (5000 UT) TABS Take 1 tablet by mouth daily.    Marland Kitchen rOPINIRole (REQUIP) 0.5 MG tablet Take 0.5 mg by mouth 3 (three) times daily.    Marland Kitchen ascorbic acid (VITAMIN C) 500 MG tablet Take 500 mg by mouth daily. (Patient not taking: Reported on 08/26/2020)    . chlorthalidone (HYGROTON) 25 MG tablet Take 25 mg by mouth daily. (Patient not taking: Reported on 08/06/2020)    . Diclofenac Sodium CR 100 MG 24 hr tablet Take 1 tablet by mouth daily as needed. (Patient not taking: Reported on 08/06/2020)    . FLUoxetine (PROZAC) 10 MG capsule Take 10 mg by mouth daily. (Patient not taking: Reported on 08/06/2020)    . gemfibrozil (LOPID) 600 MG tablet Take 600 mg by mouth 2 (two) times daily before a meal. (Patient not taking: Reported on 08/06/2020)    . nicotine (NICODERM CQ - DOSED IN MG/24 HOURS) 21 mg/24hr patch Place 21 mg onto the skin daily. (Patient not taking: Reported on 08/06/2020)    . traMADol (ULTRAM) 50 MG tablet Take 1 tablet (50 mg total) by mouth every 6 (six) hours as needed. (Patient not taking: Reported on 08/06/2020) 30 tablet 0   No current facility-administered medications for this visit.    OBJECTIVE: There were no vitals filed for this visit.   There is no height or weight on file to calculate BMI.    ECOG FS:0 - Asymptomatic   LAB RESULTS:  No results found for: NA, K, CL, CO2, GLUCOSE, BUN, CREATININE, CALCIUM, PROT, ALBUMIN, AST, ALT, ALKPHOS, BILITOT, GFRNONAA, GFRAA  Lab Results  Component Value Date   WBC 7.7 08/06/2020   HGB 13.4 08/06/2020   HCT 39.7 08/06/2020   MCV 85.7 08/06/2020   PLT 257 08/06/2020     STUDIES: No results found.  ASSESSMENT: Polycythemia  PLAN:    1. Polycythemia: Patient's hemoglobin is within normal limits today. All of her other laboratory work including iron stores, erythropoietin, JAK2 mutation, hemochromatosis mutation are all  either negative or within normal limits. Carbon monoxide levels are increased which is likely contributing to her polycythemia. No intervention is needed at this time. Patient does not require bone marrow biopsy or phlebotomy. No further follow-up has been scheduled. Please refer patient back if there are any questions or concerns. 2. Tobacco use: Likely the etiology of her polycythemia. Patient has expressed interest in quitting and will further discussed strategies with her primary care physician.    I provided 20 minutes of face-to-face video visit time during this encounter which included chart review, counseling, and coordination of care as documented above.    Patient expressed understanding and was in agreement with this plan. She also understands that She can call clinic at any time with any questions, concerns, or complaints.     Lloyd Huger, MD   08/28/2020 8:56 AM

## 2020-08-26 ENCOUNTER — Inpatient Hospital Stay (HOSPITAL_BASED_OUTPATIENT_CLINIC_OR_DEPARTMENT_OTHER): Admitting: Oncology

## 2020-08-26 ENCOUNTER — Encounter: Payer: Self-pay | Admitting: Oncology

## 2020-08-26 DIAGNOSIS — D751 Secondary polycythemia: Secondary | ICD-10-CM

## 2020-09-02 ENCOUNTER — Other Ambulatory Visit: Payer: Self-pay

## 2020-09-02 ENCOUNTER — Ambulatory Visit
Admission: RE | Admit: 2020-09-02 | Discharge: 2020-09-02 | Disposition: A | Discharge status: Home / Self Care | Source: Ambulatory Visit | Attending: Family Medicine | Admitting: Family Medicine

## 2020-09-02 DIAGNOSIS — Z1231 Encounter for screening mammogram for malignant neoplasm of breast: Secondary | ICD-10-CM | POA: Diagnosis present

## 2020-09-09 ENCOUNTER — Other Ambulatory Visit: Payer: Self-pay | Admitting: Family Medicine

## 2020-09-09 DIAGNOSIS — N6489 Other specified disorders of breast: Secondary | ICD-10-CM

## 2020-09-09 DIAGNOSIS — R928 Other abnormal and inconclusive findings on diagnostic imaging of breast: Secondary | ICD-10-CM

## 2020-09-17 ENCOUNTER — Ambulatory Visit
Admission: RE | Admit: 2020-09-17 | Discharge: 2020-09-17 | Disposition: A | Discharge status: Home / Self Care | Source: Ambulatory Visit | Attending: Family Medicine | Admitting: Family Medicine

## 2020-09-17 ENCOUNTER — Other Ambulatory Visit: Payer: Self-pay

## 2020-09-17 DIAGNOSIS — R928 Other abnormal and inconclusive findings on diagnostic imaging of breast: Secondary | ICD-10-CM | POA: Diagnosis present

## 2020-09-17 DIAGNOSIS — N6489 Other specified disorders of breast: Secondary | ICD-10-CM

## 2021-02-28 ENCOUNTER — Other Ambulatory Visit: Payer: Self-pay | Admitting: Internal Medicine

## 2021-02-28 DIAGNOSIS — R0781 Pleurodynia: Secondary | ICD-10-CM

## 2021-02-28 DIAGNOSIS — I208 Other forms of angina pectoris: Secondary | ICD-10-CM

## 2021-03-08 ENCOUNTER — Telehealth (HOSPITAL_COMMUNITY): Payer: Self-pay | Admitting: *Deleted

## 2021-03-08 NOTE — Telephone Encounter (Signed)
Attempted to call patient regarding upcoming cardiac CT appointment. °Left message on voicemail with name and callback number ° °Ayrianna Mcginniss RN Navigator Cardiac Imaging °[facility] °[phone] Office °[phone] Cell ° °

## 2021-03-09 ENCOUNTER — Other Ambulatory Visit (HOSPITAL_COMMUNITY): Payer: Self-pay | Admitting: *Deleted

## 2021-03-09 ENCOUNTER — Telehealth (HOSPITAL_COMMUNITY): Payer: Self-pay | Admitting: *Deleted

## 2021-03-09 MED ORDER — METOPROLOL TARTRATE 100 MG PO TABS: ORAL_TABLET | ORAL | 0 refills | Status: AC

## 2021-03-09 NOTE — Telephone Encounter (Signed)
Pt returning call regarding upcoming cardiac imaging study; pt verbalizes understanding of appt date/time, parking situation and where to check in, pre-test NPO status and medications ordered, and verified current allergies; name and call back number provided for further questions should they arise  Gordy Clement RN Navigator Cardiac Imaging [facility] [phone] office [phone] cell  A one-time 100mg  metoprolol tartrate dose was called in to pt's pharmacy in preparation for test. Pt aware to pick up and take 2 hours prior to cardiac CT scan.

## 2021-03-10 ENCOUNTER — Other Ambulatory Visit: Payer: Self-pay

## 2021-03-10 ENCOUNTER — Ambulatory Visit
Admission: RE | Admit: 2021-03-10 | Discharge: 2021-03-10 | Disposition: A | Discharge status: Home / Self Care | Source: Ambulatory Visit | Attending: Internal Medicine | Admitting: Internal Medicine

## 2021-03-10 DIAGNOSIS — I208 Other forms of angina pectoris: Secondary | ICD-10-CM | POA: Insufficient documentation

## 2021-03-10 DIAGNOSIS — R911 Solitary pulmonary nodule: Secondary | ICD-10-CM | POA: Diagnosis not present

## 2021-03-10 DIAGNOSIS — R079 Chest pain, unspecified: Secondary | ICD-10-CM | POA: Diagnosis not present

## 2021-03-10 HISTORY — DX: Essential (primary) hypertension: I10

## 2021-03-10 MED ORDER — IOHEXOL 350 MG/ML SOLN
75.0000 mL | Freq: Once | INTRAVENOUS | Status: AC | PRN
  Administered 2021-03-10: 75 mL via INTRAVENOUS

## 2021-03-10 MED ORDER — NITROGLYCERIN 0.4 MG SL SUBL
0.8000 mg | SUBLINGUAL_TABLET | Freq: Once | SUBLINGUAL | Status: AC
  Administered 2021-03-10: 0.8 mg via SUBLINGUAL

## 2021-03-10 NOTE — Progress Notes (Signed)
Patient tolerated procedure well. Ambulate w/o difficulty. Sitting in chair drinking water and coffee provided. Encouraged to drink extra water today and reasoning explained. Verbalized understanding. All questions answered. ABC intact. No further needs. Discharge from procedure area w/o issues.  

## 2021-08-11 ENCOUNTER — Other Ambulatory Visit: Payer: Self-pay | Admitting: Family Medicine

## 2021-08-11 DIAGNOSIS — Z1231 Encounter for screening mammogram for malignant neoplasm of breast: Secondary | ICD-10-CM

## 2021-09-05 ENCOUNTER — Other Ambulatory Visit: Payer: Self-pay

## 2021-09-05 ENCOUNTER — Ambulatory Visit
Admission: RE | Admit: 2021-09-05 | Discharge: 2021-09-05 | Disposition: A | Discharge status: Home / Self Care | Source: Ambulatory Visit | Attending: Family Medicine | Admitting: Family Medicine

## 2021-09-05 DIAGNOSIS — Z1231 Encounter for screening mammogram for malignant neoplasm of breast: Secondary | ICD-10-CM | POA: Diagnosis present

## 2022-04-03 NOTE — Addendum Note (Signed)
Encounter addended by: Annie Paras on: 04/03/2022 1:17 PM  Actions taken: Letter saved

## 2022-05-11 ENCOUNTER — Other Ambulatory Visit: Payer: Self-pay | Admitting: Family Medicine

## 2022-05-11 DIAGNOSIS — Z1231 Encounter for screening mammogram for malignant neoplasm of breast: Secondary | ICD-10-CM

## 2022-05-11 DIAGNOSIS — Z13820 Encounter for screening for osteoporosis: Secondary | ICD-10-CM

## 2022-09-07 ENCOUNTER — Ambulatory Visit
Admission: RE | Admit: 2022-09-07 | Discharge: 2022-09-07 | Disposition: A | Discharge status: Home / Self Care | Source: Ambulatory Visit | Attending: Family Medicine | Admitting: Family Medicine

## 2022-09-07 DIAGNOSIS — Z1231 Encounter for screening mammogram for malignant neoplasm of breast: Secondary | ICD-10-CM

## 2022-09-07 DIAGNOSIS — Z13820 Encounter for screening for osteoporosis: Secondary | ICD-10-CM | POA: Diagnosis present

## 2023-02-21 ENCOUNTER — Emergency Department
Admission: EM | Admit: 2023-02-21 | Discharge: 2023-02-21 | Disposition: A | Discharge status: Home / Self Care | Attending: Emergency Medicine | Admitting: Emergency Medicine

## 2023-02-21 ENCOUNTER — Other Ambulatory Visit: Payer: Self-pay

## 2023-02-21 ENCOUNTER — Encounter: Payer: Self-pay | Admitting: Emergency Medicine

## 2023-02-21 ENCOUNTER — Emergency Department

## 2023-02-21 DIAGNOSIS — I10 Essential (primary) hypertension: Secondary | ICD-10-CM | POA: Diagnosis not present

## 2023-02-21 DIAGNOSIS — K2100 Gastro-esophageal reflux disease with esophagitis, without bleeding: Secondary | ICD-10-CM | POA: Insufficient documentation

## 2023-02-21 DIAGNOSIS — R079 Chest pain, unspecified: Secondary | ICD-10-CM

## 2023-02-21 DIAGNOSIS — R072 Precordial pain: Secondary | ICD-10-CM | POA: Diagnosis present

## 2023-02-21 LAB — CBC
HCT: 43.5 % (ref 36.0–46.0)
Hemoglobin: 14.4 g/dL (ref 12.0–15.0)
MCH: 29.1 pg (ref 26.0–34.0)
MCHC: 33.1 g/dL (ref 30.0–36.0)
MCV: 87.9 fL (ref 80.0–100.0)
Platelets: 249 10*3/uL (ref 150–400)
RBC: 4.95 MIL/uL (ref 3.87–5.11)
RDW: 12.2 % (ref 11.5–15.5)
WBC: 6.5 10*3/uL (ref 4.0–10.5)
nRBC: 0 % (ref 0.0–0.2)

## 2023-02-21 LAB — BASIC METABOLIC PANEL
Anion gap: 10 (ref 5–15)
BUN: 11 mg/dL (ref 6–20)
CO2: 27 mmol/L (ref 22–32)
Calcium: 10.2 mg/dL (ref 8.9–10.3)
Chloride: 102 mmol/L (ref 98–111)
Creatinine, Ser: 0.91 mg/dL (ref 0.44–1.00)
GFR, Estimated: >60 mL/min (ref >60)
Glucose, Bld: 120 mg/dL — ABNORMAL HIGH (ref 70–99)
Potassium: 3.5 mmol/L (ref 3.5–5.1)
Sodium: 139 mmol/L (ref 135–145)

## 2023-02-21 LAB — TROPONIN I (HIGH SENSITIVITY): Troponin I (High Sensitivity): 3 ng/L (ref <18)

## 2023-02-21 MED ORDER — OMEPRAZOLE MAGNESIUM 20 MG PO TBEC: 20.0000 mg | DELAYED_RELEASE_TABLET | Freq: Two times a day (BID) | ORAL | 0 refills | Status: AC

## 2023-02-21 NOTE — ED Notes (Signed)
Patient transported to X-ray 

## 2023-02-21 NOTE — ED Provider Notes (Signed)
[facility] Provider Note   Event Date/Time   First MD Initiated Contact with Patient 02/21/23 1726     (approximate) History  Chest Pain  HPI Teresa Cordova is a 62 y.o. female with a stated past medical history of GERD and hypertension who presents complaining of substernal chest pressure and burning that have significantly worsened this morning upon awakening.  Patient states that she has had significant increased stress as well as increased alcohol use over the last few weeks due to the death of a pet.  Patient also states that she eats very close to bedtime and feels that this is a likely reason for her increased reflux symptoms. ROS: Patient currently denies any vision changes, tinnitus, difficulty speaking, facial droop, sore throat, shortness of breath, abdominal pain, nausea/vomiting/diarrhea, dysuria, or weakness/numbness/paresthesias in any extremity   Physical Exam  Triage Vital Signs: ED Triage Vitals  Enc Vitals Group     BP 02/21/23 1714 (!) 182/89     Pulse Rate 02/21/23 1714 82     Resp 02/21/23 1714 18     Temp 02/21/23 1714 98.4 F (36.9 C)     Temp Source 02/21/23 1714 Oral     SpO2 02/21/23 1714 97 %     Weight 02/21/23 1713 140 lb (63.5 kg)     Height 02/21/23 1713  (1.575 m)     Head Circumference --      Peak Flow --      Pain Score 02/21/23 1713 5     Pain Loc --      Pain Edu? --      Excl. in GC? --    Most recent vital signs: Vitals:   02/21/23 1714  BP: (!) 182/89  Pulse: 82  Resp: 18  Temp: 98.4 F (36.9 C)  SpO2: 97%   General: Awake, oriented x4. CV:  Good peripheral perfusion.  Resp:  Normal effort.  Abd:  No distention.  Other:  Middle-aged overweight African-American female laying in bed in no acute distress ED Results / Procedures / Treatments  Labs (all labs ordered are listed, but only abnormal results are displayed) Labs Reviewed  BASIC METABOLIC PANEL - Abnormal; Notable for the following  components:      Result Value   Glucose, Bld 120 (*)    All other components within normal limits  CBC  TROPONIN I (HIGH SENSITIVITY)  TROPONIN I (HIGH SENSITIVITY)   EKG ED ECG REPORT I, Merwyn Katos, the attending physician, personally viewed and interpreted this ECG. Date: 02/21/2023 EKG Time: 1715 Rate: 70 Rhythm: normal sinus rhythm QRS Axis: normal Intervals: normal ST/T Wave abnormalities: normal Narrative Interpretation: no evidence of acute ischemia RADIOLOGY ED MD interpretation: 2 view chest x-ray interpreted by me shows no evidence of acute abnormalities including no pneumonia, pneumothorax, or widened mediastinum -Agree with radiology assessment Official radiology report(s): DG Chest 2 View  Result Date: 02/21/2023 CLINICAL DATA:  Recent fall, low back pain, hip pain EXAM: CHEST - 2 VIEW COMPARISON:  None Available. FINDINGS: Normal heart size, mediastinal contours, and pulmonary vascularity. Lungs hyperinflated but clear. No pleural effusion or pneumothorax. Bones unremarkable. IMPRESSION: Hyperinflated lungs without acute infiltrate. Electronically Signed   By: Ulyses Southward M.D.   On: 02/21/2023 17:52   PROCEDURES: Critical Care performed: No .1-3 Lead EKG Interpretation  Performed by: Merwyn Katos, MD Authorized by: Merwyn Katos, MD     Interpretation: normal     ECG rate:  71   ECG rate assessment: normal     Rhythm: sinus rhythm     Ectopy: none     Conduction: normal    MEDICATIONS ORDERED IN ED: Medications - No data to display IMPRESSION / MDM / ASSESSMENT AND PLAN / ED COURSE  I reviewed the triage vital signs and the nursing notes.                             The patient is on the cardiac monitor to evaluate for evidence of arrhythmia and/or significant heart rate changes. Patient's presentation is most consistent with acute presentation with potential threat to life or bodily function. Workup: ECG, CXR, CBC, BMP, Troponin Findings: ECG:  No overt evidence of STEMI. No evidence of Brugadas sign, delta wave, epsilon wave, significantly prolonged QTc, or malignant arrhythmia HS Troponin: Negative x1 Other Labs unremarkable for emergent problems. CXR: Without PTX, PNA, or widened mediastinum Last Stress Test:  04/2020 Last Heart Catheterization: N/A HEART Score: 3  Given History, Exam, and Workup I have low suspicion for ACS, Pneumothorax, Pneumonia, Pulmonary Embolus, Tamponade, Aortic Dissection or other emergent problem as a cause for this presentation.   Reassesment: Prior to discharge patients pain was controlled and they were well appearing.  Disposition:  Discharge. Strict return precautions discussed with patient with full understanding. Advised patient to follow up promptly with primary care provider  Clinical Course as of 02/21/23 1855  Wed Feb 21, 2023  1738 Troponin I (High Sensitivity) [CI]    Clinical Course User Index [CI] Bryson Ha, [place]   FINAL CLINICAL IMPRESSION(S) / ED DIAGNOSES   Final diagnoses:  Chest pain, unspecified type  Gastroesophageal reflux disease with esophagitis without hemorrhage   Rx / DC Orders   ED Discharge Orders          Ordered    omeprazole (PRILOSEC OTC) 20 MG tablet  2 times daily        02/21/23 1848           Note:  This document was prepared using Dragon voice recognition software and may include unintentional dictation errors.   Merwyn Katos, MD 02/21/23 [phone]

## 2023-02-21 NOTE — ED Triage Notes (Signed)
Patient to ED via POV for chest pain. Centralized, intermittent for the past week. Woke up with pain today. No cardiac hx but has acid reflux.

## 2023-08-20 ENCOUNTER — Other Ambulatory Visit: Payer: Self-pay | Admitting: Family Medicine

## 2023-08-20 DIAGNOSIS — Z1231 Encounter for screening mammogram for malignant neoplasm of breast: Secondary | ICD-10-CM

## 2023-09-11 ENCOUNTER — Ambulatory Visit
Admission: RE | Admit: 2023-09-11 | Discharge: 2023-09-11 | Disposition: A | Discharge status: Home / Self Care | Source: Ambulatory Visit | Attending: Family Medicine | Admitting: Family Medicine

## 2023-09-11 DIAGNOSIS — Z1231 Encounter for screening mammogram for malignant neoplasm of breast: Secondary | ICD-10-CM | POA: Insufficient documentation

## 2023-09-26 IMAGING — MG MM DIGITAL SCREENING BILAT W/ TOMO AND CAD
6 of 10 series · 6 of 30 positions shown · non-contrast
Comparison: Previous exam(s).

CLINICAL DATA: Screening.

EXAM:
DIGITAL SCREENING BILATERAL MAMMOGRAM WITH TOMOSYNTHESIS AND CAD
TECHNIQUE: Bilateral screening digital craniocaudal and mediolateral oblique
mammograms were obtained. Bilateral screening digital breast
tomosynthesis was performed. The images were evaluated with
computer-aided detection.

[L CC synth-2D]
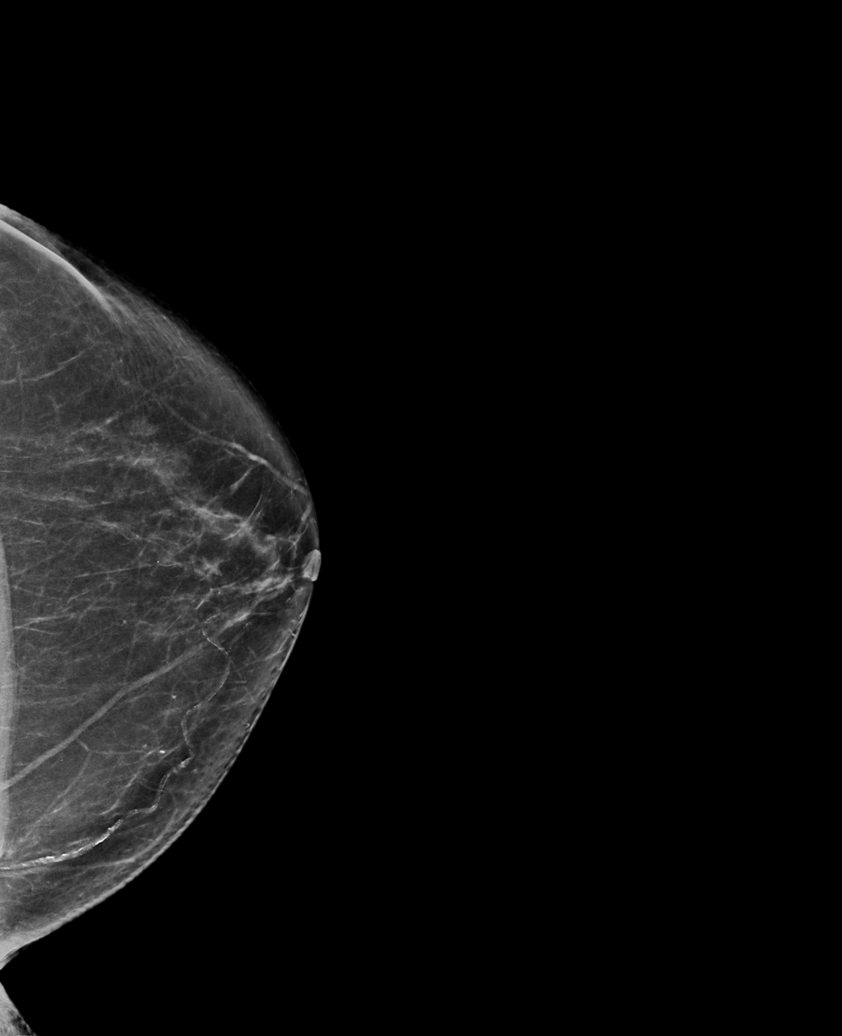

[R CC synth-2D (1 of 2)]
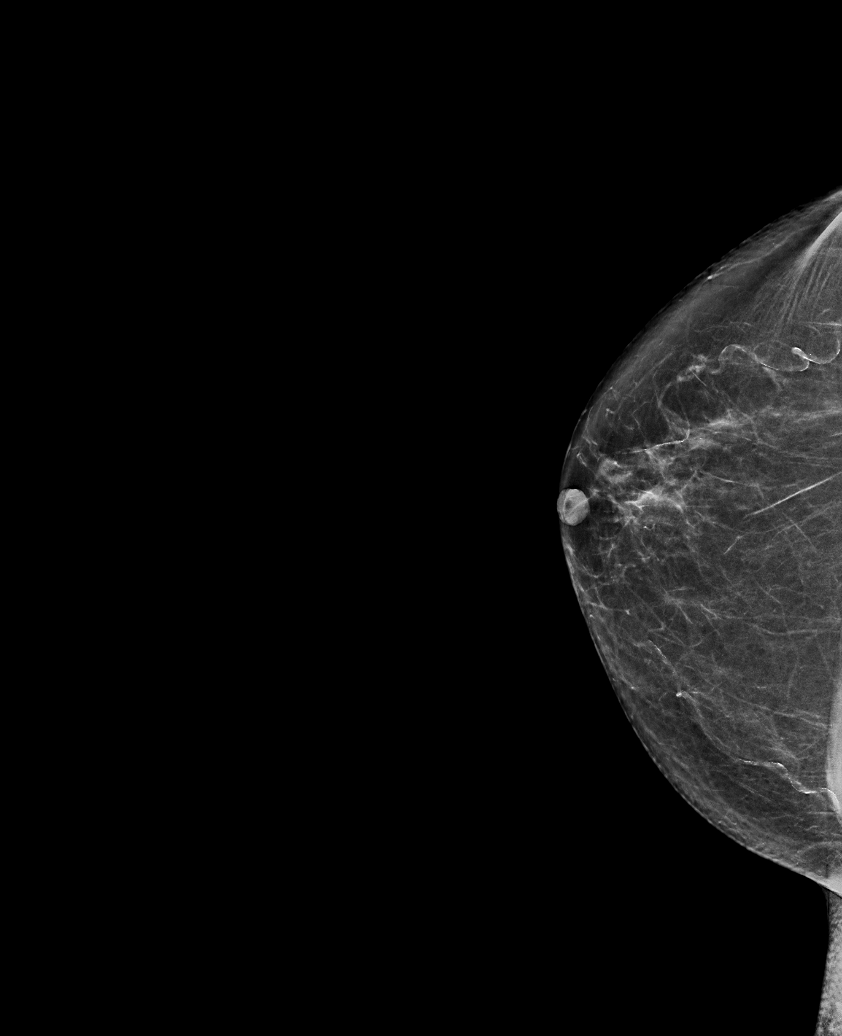

[L MLO synth-2D]
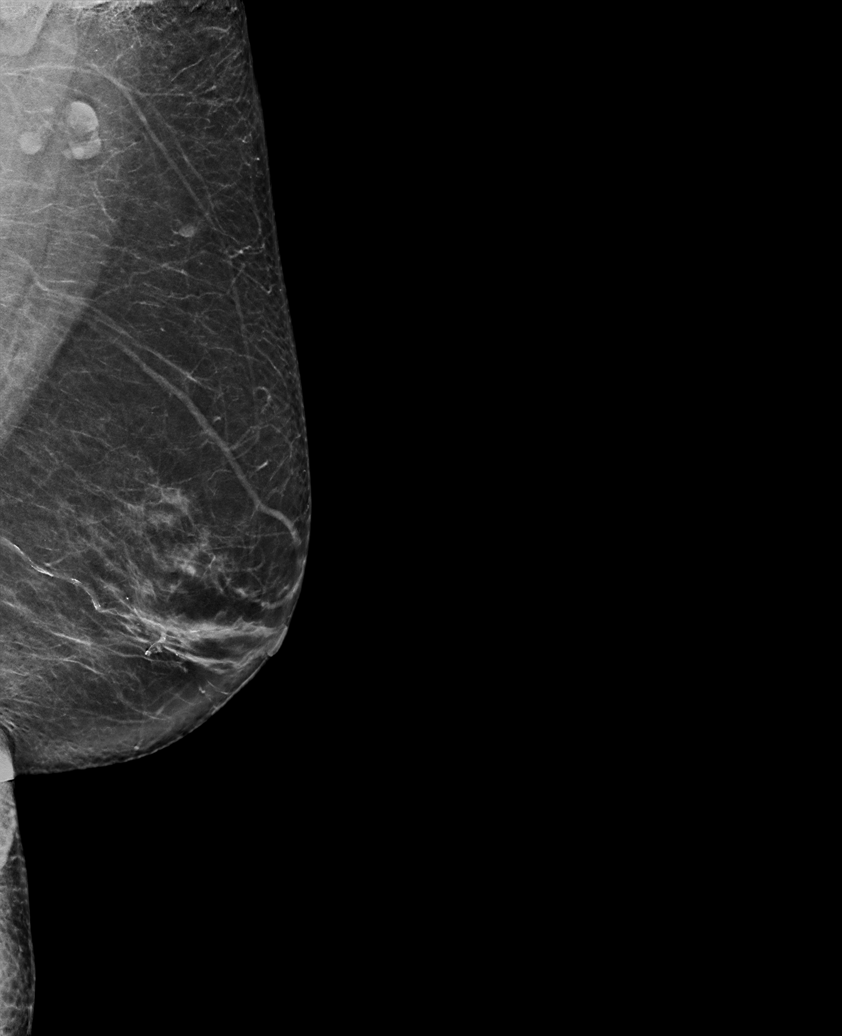

[R CC synth-2D (2 of 2)]
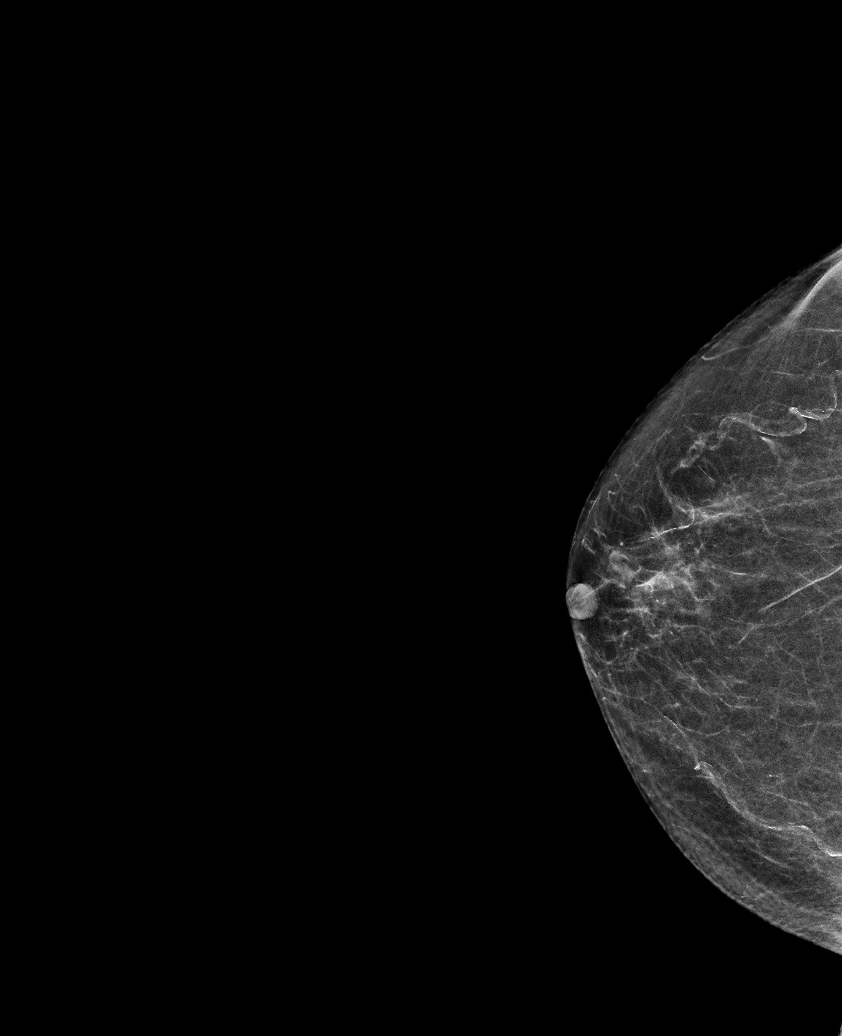

[R MLO synth-2D]
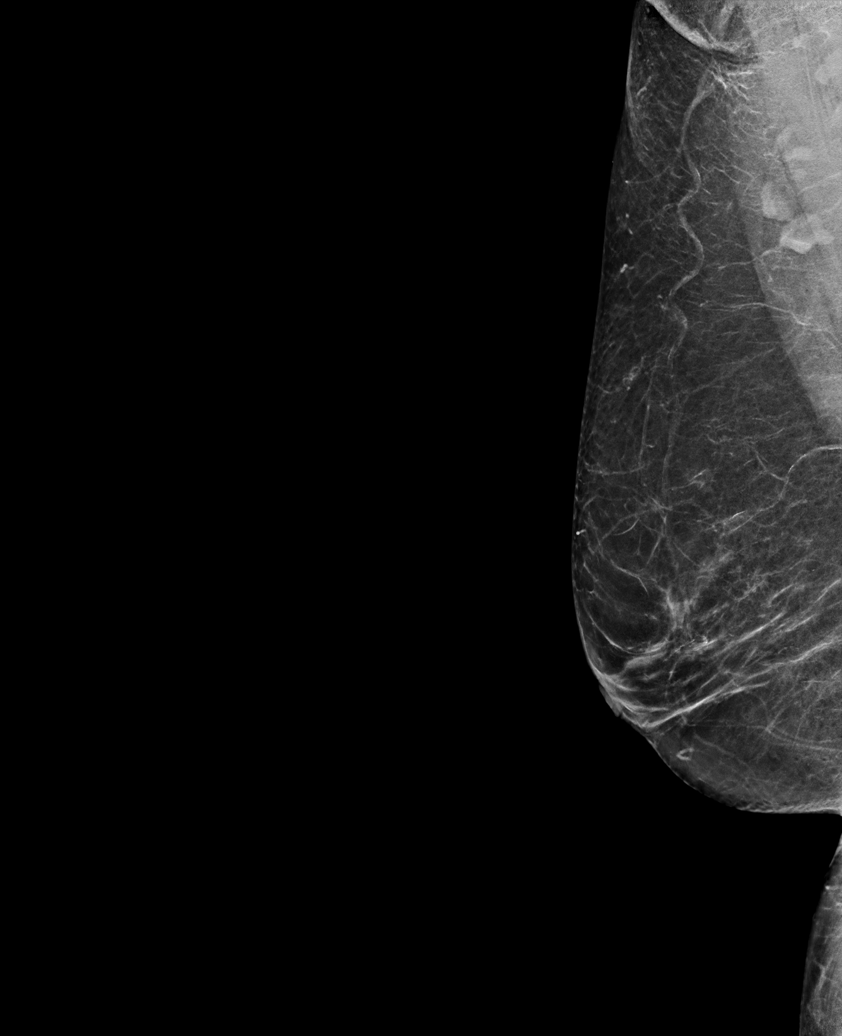

[L CC tomo · tomo slice 37/72.0]
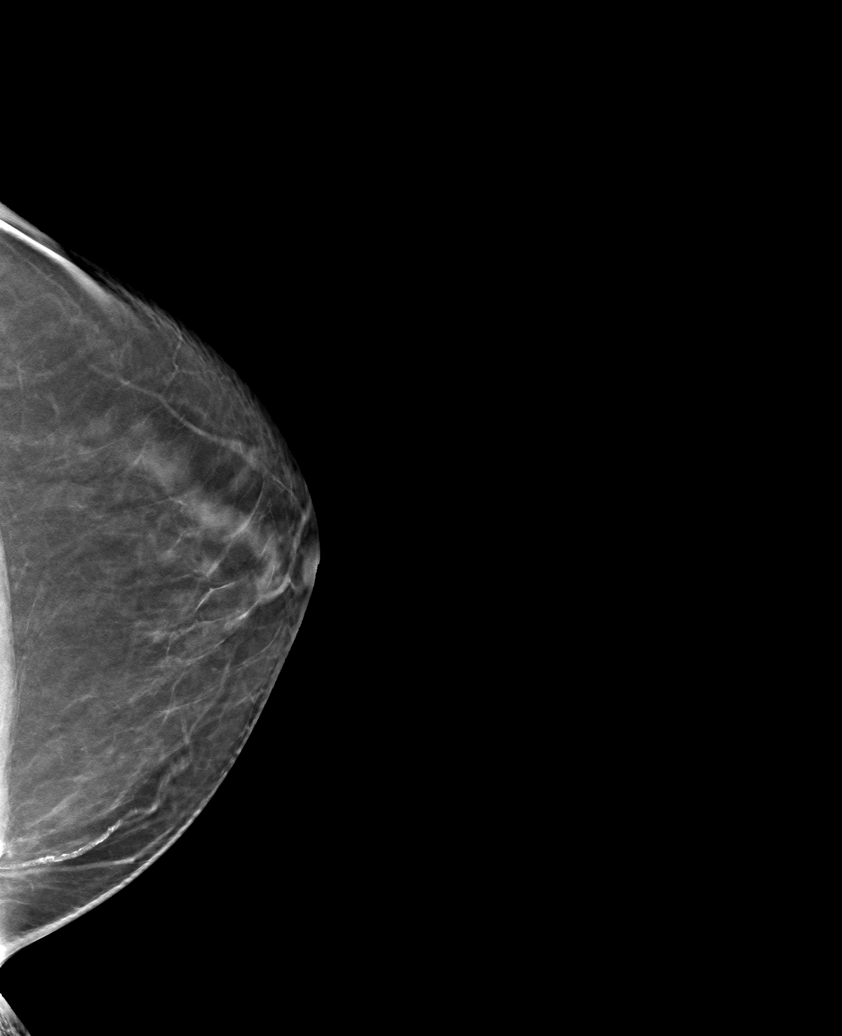

[6 of 30 positions shown; findings below may reference images not displayed]

ACR Breast Density Category b: There are scattered areas of
fibroglandular density.
FINDINGS: There are no findings suspicious for malignancy.
IMPRESSION: No mammographic evidence of malignancy. A result letter of this
screening mammogram will be mailed directly to the patient.

RECOMMENDATION:
Screening mammogram in one year. (Code:51-O-LD2)

BI-RADS CATEGORY  1: Negative.

## 2024-01-16 ENCOUNTER — Emergency Department

## 2024-01-16 ENCOUNTER — Other Ambulatory Visit: Payer: Self-pay

## 2024-01-16 ENCOUNTER — Emergency Department
Admission: EM | Admit: 2024-01-16 | Discharge: 2024-01-16 | Disposition: A | Discharge status: Home / Self Care | Attending: Emergency Medicine | Admitting: Emergency Medicine

## 2024-01-16 DIAGNOSIS — I10 Essential (primary) hypertension: Secondary | ICD-10-CM | POA: Insufficient documentation

## 2024-01-16 DIAGNOSIS — R1032 Left lower quadrant pain: Secondary | ICD-10-CM | POA: Insufficient documentation

## 2024-01-16 LAB — COMPREHENSIVE METABOLIC PANEL
ALT: 14 U/L (ref 0–44)
AST: 21 U/L (ref 15–41)
Albumin: 4.8 g/dL (ref 3.5–5.0)
Alkaline Phosphatase: 75 U/L (ref 38–126)
Anion gap: 7 (ref 5–15)
BUN: 13 mg/dL (ref 8–23)
CO2: 27 mmol/L (ref 22–32)
Calcium: 9.8 mg/dL (ref 8.9–10.3)
Chloride: 104 mmol/L (ref 98–111)
Creatinine, Ser: 0.82 mg/dL (ref 0.44–1.00)
GFR, Estimated: >60 mL/min (ref >60)
Glucose, Bld: 141 mg/dL — ABNORMAL HIGH (ref 70–99)
Potassium: 3.8 mmol/L (ref 3.5–5.1)
Sodium: 138 mmol/L (ref 135–145)
Total Bilirubin: 0.6 mg/dL (ref 0.0–1.2)
Total Protein: 8.7 g/dL — ABNORMAL HIGH (ref 6.5–8.1)

## 2024-01-16 LAB — URINALYSIS, ROUTINE W REFLEX MICROSCOPIC
Bacteria, UA: NONE SEEN
Bilirubin Urine: NEGATIVE
Glucose, UA: NEGATIVE mg/dL
Ketones, ur: NEGATIVE mg/dL
Leukocytes,Ua: NEGATIVE
Nitrite: NEGATIVE
Protein, ur: NEGATIVE mg/dL
RBC / HPF: 0 RBC/hpf (ref 0–5)
Specific Gravity, Urine: 1.001 — ABNORMAL LOW (ref 1.005–1.030)
WBC, UA: 0 WBC/hpf (ref 0–5)
pH: 6 (ref 5.0–8.0)

## 2024-01-16 LAB — CBC
HCT: 41.9 % (ref 36.0–46.0)
Hemoglobin: 14 g/dL (ref 12.0–15.0)
MCH: 29.6 pg (ref 26.0–34.0)
MCHC: 33.4 g/dL (ref 30.0–36.0)
MCV: 88.6 fL (ref 80.0–100.0)
Platelets: 269 10*3/uL (ref 150–400)
RBC: 4.73 MIL/uL (ref 3.87–5.11)
RDW: 12.5 % (ref 11.5–15.5)
WBC: 5.3 10*3/uL (ref 4.0–10.5)
nRBC: 0 % (ref 0.0–0.2)

## 2024-01-16 LAB — LIPASE, BLOOD: Lipase: 35 U/L (ref 11–51)

## 2024-01-16 MED ORDER — DICYCLOMINE HCL 10 MG PO CAPS: 10.0000 mg | ORAL_CAPSULE | Freq: Three times a day (TID) | ORAL | 0 refills | Status: AC | PRN

## 2024-01-16 NOTE — ED Provider Notes (Signed)
 [facility] Provider Note    Event Date/Time   First MD Initiated Contact with Patient 01/16/24 1627     (approximate)  History   Chief Complaint: Abdominal Pain  HPI  Teresa Cordova is a 63 y.o. female with a past medical history of hypertension, hyperlipidemia, presents emergency department for left lower quadrant abdominal pain.  According to the patient for the past 2 weeks or so she has been experiencing the pain in the abdomen mostly in the left lower quadrant.  Patient denies any vomiting or diarrhea.  No fever.  Patient saw her doctor who put her on a course of antibiotics for presumed diverticulitis.  Patient states the pain has continued.  No urinary symptoms.  No chest pain or shortness of breath.  Patient states the pain is mostly when she moves.  Denies any pain to palpation.  Physical Exam   Triage Vital Signs: ED Triage Vitals  Encounter Vitals Group     BP 01/16/24 1245 (!) 184/90     Systolic BP Percentile --      Diastolic BP Percentile --      Pulse Rate 01/16/24 1245 84     Resp 01/16/24 1245 18     Temp 01/16/24 1245 98.4 F (36.9 C)     Temp Source 01/16/24 1245 Oral     SpO2 01/16/24 1245 97 %     Weight 01/16/24 1246 139 lb (63 kg)     Height 01/16/24 1246 5\' 2"  (1.575 m)     Head Circumference --      Peak Flow --      Pain Score 01/16/24 1256 10     Pain Loc --      Pain Education --      Exclude from Growth Chart --     Most recent vital signs: Vitals:   01/16/24 1245 01/16/24 1750  BP: (!) 184/90 (!) 163/70  Pulse: 84 68  Resp: 18 16  Temp: 98.4 F (36.9 C)   SpO2: 97% 98%    General: Awake, no distress.  CV:  Good peripheral perfusion.  Regular rate and rhythm  Resp:  Normal effort.  Equal breath sounds bilaterally.  Abd:  No distention.  Soft, nontender.  Benign abdomen  ED Results / Procedures / Treatments   RADIOLOGY  I have reviewed and interpreted CT images.  No obvious diverticulitis or stranding  on my evaluation. Radiology is read the CT scan is negative for acute process.   MEDICATIONS ORDERED IN ED: Medications - No data to display   IMPRESSION / MDM / ASSESSMENT AND PLAN / ED COURSE  I reviewed the triage vital signs and the nursing notes.  Patient's presentation is most consistent with acute presentation with potential threat to life or bodily function.  Patient presents emergency department for left lower quadrant pain ongoing over the last 2 weeks or so.  Patient's workup shows a reassuring CBC is a normal white blood cell count, reassuring chemistry with normal LFTs, normal lipase.  Patient's urinalysis has resulted normal as well.  Patient CT scan has resulted no sign of any kidney stone, no sign of diverticulitis.  Patient's pain is worse with movement such as sitting up, possibly indicative of more muscular pain.  Could also be intestinal type pain.  Will place the patient on Bentyl to be used if needed we will have the patient follow-up with her doctor.  Patient agreeable to plan of care.  FINAL CLINICAL IMPRESSION(S) /  ED DIAGNOSES   Left lower quadrant abdominal pain  Rx / DC Orders   Bentyl  Note:  This document was prepared using Dragon voice recognition software and may include unintentional dictation errors.   Minna Antis, MD 01/16/24 [phone]

## 2024-01-16 NOTE — ED Triage Notes (Signed)
 Pt to ED via POV from home. Pt rpeorts LLQ pain that has been ongoing for 2-3wks. Pt reports was put on antibiotics by PCP after thinking it was an infection in the colon. Pt had [place] was negative. Pt reports pain still present and getting worse. Pt denies N/V. Pt reports some diarrhea.

## 2024-01-16 NOTE — ED Notes (Signed)
 Rn to bedside to introduce self to pt. Pt alert and in no acute distress.

## 2024-11-05 ENCOUNTER — Other Ambulatory Visit: Payer: Self-pay | Admitting: Family Medicine

## 2024-11-05 DIAGNOSIS — Z1231 Encounter for screening mammogram for malignant neoplasm of breast: Secondary | ICD-10-CM

## 2024-11-07 ENCOUNTER — Ambulatory Visit
Admission: RE | Admit: 2024-11-07 | Discharge: 2024-11-07 | Disposition: A | Source: Ambulatory Visit | Attending: Family Medicine

## 2024-11-07 DIAGNOSIS — Z1231 Encounter for screening mammogram for malignant neoplasm of breast: Secondary | ICD-10-CM | POA: Diagnosis present
